# Patient Record
Sex: Female | Born: 1975 | Race: White | Hispanic: No | Marital: Married | Smoking: Never smoker
Health system: Southern US, Community
[De-identification: ages and names within clinical notes are randomized; demographics above are authoritative.]

## PROBLEM LIST (undated history)

## (undated) DIAGNOSIS — N926 Irregular menstruation, unspecified: Secondary | ICD-10-CM

## (undated) DIAGNOSIS — R112 Nausea with vomiting, unspecified: Secondary | ICD-10-CM

## (undated) DIAGNOSIS — E221 Hyperprolactinemia: Secondary | ICD-10-CM

## (undated) DIAGNOSIS — R079 Chest pain, unspecified: Principal | ICD-10-CM

## (undated) HISTORY — DX: Hyperprolactinemia: E22.1

## (undated) HISTORY — PX: ABDOMINAL HYSTERECTOMY: SHX81

## (undated) HISTORY — DX: Chest pain, unspecified: R07.9

## (undated) HISTORY — DX: Irregular menstruation, unspecified: N92.6

## (undated) HISTORY — PX: LAPAROSCOPY: SHX197

---

## 1997-07-15 ENCOUNTER — Other Ambulatory Visit: Admission: RE | Admit: 1997-07-15 | Discharge: 1997-07-15 | Payer: Self-pay | Admitting: Obstetrics and Gynecology

## 1998-08-13 ENCOUNTER — Other Ambulatory Visit: Admission: RE | Admit: 1998-08-13 | Discharge: 1998-08-13 | Payer: Self-pay | Admitting: Obstetrics and Gynecology

## 1999-06-11 ENCOUNTER — Inpatient Hospital Stay (HOSPITAL_COMMUNITY): Admission: AD | Admit: 1999-06-11 | Discharge: 1999-06-11 | Payer: Self-pay | Admitting: Obstetrics & Gynecology

## 2011-09-23 ENCOUNTER — Other Ambulatory Visit: Payer: Self-pay | Admitting: Family Medicine

## 2011-09-23 DIAGNOSIS — R51 Headache: Secondary | ICD-10-CM

## 2011-09-28 ENCOUNTER — Other Ambulatory Visit: Payer: Self-pay

## 2015-07-21 ENCOUNTER — Other Ambulatory Visit: Payer: Self-pay | Admitting: Family Medicine

## 2015-07-21 DIAGNOSIS — R109 Unspecified abdominal pain: Secondary | ICD-10-CM

## 2015-07-23 ENCOUNTER — Other Ambulatory Visit: Payer: Self-pay

## 2015-07-28 ENCOUNTER — Ambulatory Visit
Admission: RE | Admit: 2015-07-28 | Discharge: 2015-07-28 | Disposition: A | Discharge status: Home / Self Care | Payer: BLUE CROSS/BLUE SHIELD | Source: Ambulatory Visit | Attending: Family Medicine | Admitting: Family Medicine

## 2015-07-28 DIAGNOSIS — R109 Unspecified abdominal pain: Secondary | ICD-10-CM

## 2015-11-26 ENCOUNTER — Other Ambulatory Visit (HOSPITAL_COMMUNITY): Payer: Self-pay | Admitting: Gastroenterology

## 2015-11-26 DIAGNOSIS — R1011 Right upper quadrant pain: Secondary | ICD-10-CM

## 2015-12-08 ENCOUNTER — Encounter (HOSPITAL_COMMUNITY)
Admission: RE | Admit: 2015-12-08 | Discharge: 2015-12-08 | Disposition: A | Discharge status: Home / Self Care | Payer: BLUE CROSS/BLUE SHIELD | Source: Ambulatory Visit | Attending: Gastroenterology | Admitting: Gastroenterology

## 2015-12-08 ENCOUNTER — Encounter (HOSPITAL_COMMUNITY): Payer: Self-pay | Admitting: Radiology

## 2015-12-08 DIAGNOSIS — R1011 Right upper quadrant pain: Secondary | ICD-10-CM | POA: Insufficient documentation

## 2015-12-08 MED ORDER — TECHNETIUM TC 99M MEBROFENIN IV KIT
5.0000 | PACK | Freq: Once | INTRAVENOUS | Status: AC | PRN
  Administered 2015-12-08: 5 via INTRAVENOUS

## 2016-03-24 DIAGNOSIS — E221 Hyperprolactinemia: Secondary | ICD-10-CM

## 2016-03-24 HISTORY — DX: Hyperprolactinemia: E22.1

## 2017-04-21 DIAGNOSIS — N926 Irregular menstruation, unspecified: Secondary | ICD-10-CM | POA: Insufficient documentation

## 2017-04-21 HISTORY — DX: Irregular menstruation, unspecified: N92.6

## 2017-11-17 DIAGNOSIS — R079 Chest pain, unspecified: Secondary | ICD-10-CM

## 2017-11-17 HISTORY — DX: Chest pain, unspecified: R07.9

## 2017-11-17 NOTE — Progress Notes (Signed)
Cardiology Office Note:    Date:  11/20/2017   ID:  Lori Tyler, DOB Dec 01, 1975, MRN [id number]  PCP:  Kaleen Mask, MD  Cardiologist:  Norman Herrlich, MD   Referring MD: Kaleen Mask, *  ASSESSMENT:    1. Chest pain in adult   2. Essential hypertension   3. Menstrual migraine without status migrainosus, not intractable    PLAN:    In order of problems listed above:  1. Atypical nonanginal in the setting of Imitrex therapy likely esophageal in etiology.  Her EKG is normal for further evaluation check troponin stress echo and I suspect that she will respond to calcium channel blocker.  Has a history of reflux and will continue Carafate along with her PPI 2. Poorly controlled untreated in the past intolerant of a lipid soluble beta-blocker propranolol I asked her to start a calcium channel blocker for blood pressure control and migraine.  If she is having esophageal pain may also be of benefit 3. I asked her not to use Imitrex again and gave her the name of a neurologist Dr.Stiles Weston Settle at [facility]  Next appointment 4 weeks   Medication Adjustments/Labs and Tests Ordered: Current medicines are reviewed at length with the patient today.  Concerns regarding medicines are outlined above.  Orders Placed This Encounter  Procedures  . Troponin I  . EKG 12-Lead  . ECHOCARDIOGRAM STRESS TEST   Meds ordered this encounter  Medications  . diltiazem (CARDIZEM CD) 240 MG 24 hr capsule    Sig: Take 1 capsule (240 mg total) by mouth daily.    Dispense:  30 capsule    Refill:  1      Chief Complaint  Patient presents with  . Chest Pain    onset 1 month    History of Present Illness:    Lori Tyler is a 42 y.o. female who is being seen today for the evaluation of chest pain at the request of Kaleen Mask, *.  She has a long history of migraine menstrual in etiology.  Recently it worsened she is taken perhaps 10 doses of Imitrex  and after using it last a month ago has developed persistent chest pain.  Her to that with Imitrex she would have chest pain 4 hours substernal pressure rating to the left shoulder at times into the neck.  Nonexertional in nature and relieved with rest however in the last month its been more of a persistent waxing and waning which lasts for hours and at times all day long.  He has no history of underlying heart disease congenital rheumatic but does have hypertension which has been untreated.  She took propranolol which was ineffective to prevent migraine and poorly tolerated with CNS side effects.  She has no exertional shortness of breath palpitations syncope or TIA no family history of cardiomyopathy aortopathy or sudden death Past Medical History:  Diagnosis Date  . Chest pain in adult 11/17/2017  . Hyperprolactinemia (HCC) 03/24/2016  . Irregular menses 04/21/2017   See my not from 07/06/17    Past Surgical History:  Procedure Laterality Date  . LAPAROSCOPY      Current Medications: Current Meds  Medication Sig  . levocetirizine (XYZAL) 5 MG tablet Take 5 mg by mouth every evening.  . promethazine (PHENERGAN) 25 MG tablet as needed. For migraines  . RABEprazole (ACIPHEX) 20 MG tablet Take by mouth 2 (two) times daily.  . SUMAtriptan (IMITREX) 100 MG tablet as needed.  For migraines     Allergies:   Cephalexin   Social History   Socioeconomic History  . Marital status: Married    Spouse name: Not on file  . Number of children: Not on file  . Years of education: Not on file  . Highest education level: Not on file  Occupational History  . Not on file  Social Needs  . Financial resource strain: Not on file  . Food insecurity:    Worry: Not on file    Inability: Not on file  . Transportation needs:    Medical: Not on file    Non-medical: Not on file  Tobacco Use  . Smoking status: Never Smoker  . Smokeless tobacco: Never Used  Substance and Sexual Activity  . Alcohol use: Yes      Comment: socially/ every other weekend  . Drug use: Never  . Sexual activity: Not on file  Lifestyle  . Physical activity:    Days per week: Not on file    Minutes per session: Not on file  . Stress: Not on file  Relationships  . Social connections:    Talks on phone: Not on file    Gets together: Not on file    Attends religious service: Not on file    Active member of club or organization: Not on file    Attends meetings of clubs or organizations: Not on file    Relationship status: Not on file  Other Topics Concern  . Not on file  Social History Narrative  . Not on file     Family History: The patient's family history includes Aneurysm in her paternal grandfather; Arthritis in her mother; Diabetes in her maternal grandmother and mother; Heart attack in her maternal grandfather; Hypertension in her father.  ROS:   Review of Systems  Constitution: Negative.  HENT: Negative.   Eyes: Positive for visual disturbance (with migraine).  Cardiovascular: Positive for chest pain.  Respiratory: Positive for shortness of breath.   Endocrine: Negative.   Hematologic/Lymphatic: Negative.   Skin: Negative.   Musculoskeletal: Negative.   Gastrointestinal: Positive for dysphagia (feels as if something is stuck) and heartburn.  Genitourinary: Negative.   Neurological: Positive for headaches.  Psychiatric/Behavioral: Negative.   Allergic/Immunologic: Negative.    Please see the history of present illness.     All other systems reviewed and are negative.  EKGs/Labs/Other Studies Reviewed:    The following studies were reviewed today:   EKG:  EKG is  ordered today.  The ekg ordered today demonstrates sinus rhythm and is normal  Recent Labs: No results found for requested labs within last 8760 hours.  Recent Lipid Panel No results found for: CHOL, TRIG, HDL, CHOLHDL, VLDL, LDLCALC, LDLDIRECT  Physical Exam:    VS:  BP (!) 160/100 (BP Location: Left Arm, Patient Position:  Sitting, Cuff Size: Normal)   Pulse 78   Ht 5\' 2"  (1.575 m)   Wt 139 lb (63 kg)   SpO2 97%   BMI 25.42 kg/m     Wt Readings from Last 3 Encounters:  11/20/17 139 lb (63 kg)     GEN: There is no chest wall tenderness well nourished, well developed in no acute distress HEENT: Normal NECK: No JVD; No carotid bruits LYMPHATICS: No lymphadenopathy CARDIAC: Rub murmur RRR, no murmurs, rubs, gallops RESPIRATORY:  Clear to auscultation without rales, wheezing or rhonchi  ABDOMEN: Soft, non-tender, non-distended MUSCULOSKELETAL:  No edema; No deformity  SKIN: Warm and dry NEUROLOGIC:  Alert and oriented x 3 PSYCHIATRIC:  Normal affect     Signed, Norman Herrlich, MD  11/20/2017 4:29 PM    [facility]

## 2017-11-20 ENCOUNTER — Ambulatory Visit: Payer: BLUE CROSS/BLUE SHIELD | Admitting: Cardiology

## 2017-11-20 ENCOUNTER — Encounter (INDEPENDENT_AMBULATORY_CARE_PROVIDER_SITE_OTHER): Payer: Self-pay

## 2017-11-20 ENCOUNTER — Encounter: Payer: Self-pay | Admitting: Cardiology

## 2017-11-20 VITALS — BP 160/100 | HR 78 | Ht 62.0 in | Wt 139.0 lb

## 2017-11-20 DIAGNOSIS — R079 Chest pain, unspecified: Secondary | ICD-10-CM

## 2017-11-20 DIAGNOSIS — I10 Essential (primary) hypertension: Secondary | ICD-10-CM | POA: Insufficient documentation

## 2017-11-20 DIAGNOSIS — G43829 Menstrual migraine, not intractable, without status migrainosus: Secondary | ICD-10-CM

## 2017-11-20 MED ORDER — DILTIAZEM HCL ER COATED BEADS 240 MG PO CP24: 240.0000 mg | ORAL_CAPSULE | Freq: Every day | ORAL | 1 refills | Status: DC

## 2017-11-20 NOTE — Patient Instructions (Signed)
Medication Instructions:  Your physician has recommended you make the following change in your medication:   START: cardizem 240 mg one capsule daily   Your physician has requested that you regularly monitor and record your blood pressure readings at home. Please use the same machine at the same time of day to check your readings and record them to bring to your follow-up visit.  Dr Mervin Kung Caleen Essex, DO at [facility]  (Neuroology) for migraine headaches.  Labwork: You will have labs today:  Troponin    Testing/Procedures: Your physician has requested that you have a stress echocardiogram. For further information please visit [email or web address] Please follow instruction sheet as given.    Follow-Up: Your physician recommends that you schedule a follow-up appointment in: 4 weeks   Any Other Special Instructions Will Be Listed Below (If Applicable).     If you need a refill on your cardiac medications before your next appointment, please call your pharmacy.

## 2017-11-21 LAB — TROPONIN I: Troponin I: <0.01 ng/mL (ref 0.00–0.04)

## 2017-11-24 ENCOUNTER — Telehealth: Payer: Self-pay | Admitting: Cardiology

## 2017-11-24 NOTE — Addendum Note (Signed)
Addended by: Crist Fat on: 11/24/2017 11:32 AM   Modules accepted: Orders

## 2017-11-24 NOTE — Telephone Encounter (Signed)
Patient advised to stop Cardizem at this time and to take Claritin or zyrtec as needed for itching. Patient has been scheduled for a nurse visit to check her blood pressure on Monday, 11/27/17 at 8:00 am. Patient verbalized understanding and is agreeable to plan. No further questions.

## 2017-11-24 NOTE — Telephone Encounter (Signed)
Thinks she's having an allergic reaction to cardizem

## 2017-11-24 NOTE — Telephone Encounter (Signed)
Patient called with complaints of generalized itching that started yesterday and a rash that developed this morning from her neck to her legs. Patient was started on Cardizem 240 mg daily on Tuesday, 11/21/17 after her office visit on 11/20/17 and she thinks this is the cause. Patient states she has been checking her blood pressure daily as instructed and it was 120/78 this morning. Will have Dr. Dulce Sellar advise.

## 2017-11-24 NOTE — Telephone Encounter (Signed)
I agree, stop her med  Monday or Tuesday BP check, if > 150/90 start telmasartin 20 mg day  Can use claritin or zyrtec for itching

## 2017-11-27 ENCOUNTER — Ambulatory Visit (INDEPENDENT_AMBULATORY_CARE_PROVIDER_SITE_OTHER): Payer: BLUE CROSS/BLUE SHIELD | Admitting: Cardiology

## 2017-11-27 VITALS — BP 110/80 | HR 80 | Ht 62.0 in | Wt 139.0 lb

## 2017-11-27 DIAGNOSIS — I10 Essential (primary) hypertension: Secondary | ICD-10-CM

## 2017-11-27 NOTE — Progress Notes (Signed)
The patient presented today for BP check regarding recent reaction to diltiazem. Patient presented with rash to her torso, thighs and arms. Per the patient the rash has improved but is still quite bothersome as it is causing itching. Topical OTC benadryl cream was suggested to the patient. BP and pulse were within normal limits, no changes to be made today per Revankar. Patient will follow up as directed previously and monitor her BP at home once daily for 1 week.

## 2017-12-04 ENCOUNTER — Other Ambulatory Visit: Payer: BLUE CROSS/BLUE SHIELD

## 2017-12-07 ENCOUNTER — Other Ambulatory Visit: Payer: Self-pay

## 2017-12-07 ENCOUNTER — Telehealth: Payer: Self-pay

## 2017-12-07 DIAGNOSIS — R079 Chest pain, unspecified: Secondary | ICD-10-CM

## 2017-12-07 DIAGNOSIS — I10 Essential (primary) hypertension: Secondary | ICD-10-CM

## 2017-12-07 NOTE — Addendum Note (Signed)
Addended by: Craige Cotta on: 12/07/2017 10:21 AM   Modules accepted: Orders

## 2017-12-07 NOTE — Telephone Encounter (Signed)
-----   Message from Baldo Daub, MD sent at 12/06/2017  4:10 PM EDT ----- Regarding: RE: Nonauthorized Stress Echo Yes ----- Message ----- From: Britt Bolognese Sent: 12/06/2017   3:59 PM EDT To: Baldo Daub, MD, Kittie Plater Subject: Nonauthorized Stress Echo                      Dr. Dulce Sellar  Pt's stress echo was denied.  Do you want to change to GXT?  " CLINICAL CRITERIA    Advanced cardiac imaging is medically necessary for evaluation of patients who are unable to walk on a treadmill for reasons other than obesity or for patients with the following resting EKG abnormalities: .ST segment depression of one (1) mm or more (within the past 30 days)  .Left bundle branch block (LBBB)  .Digoxin effect  .Left ventricular hypertrophy (LVH) with secondary ST segment changes (repolarization abnormalities)  .Preexcitation syndrome (eg, Wolff-Parkinson-White)  .Ventricular paced rhythm (pacemaker)  Thanks  Charmaine

## 2017-12-07 NOTE — Telephone Encounter (Signed)
Patient was agreeable to have GXT instead of stress echo. Will send to [facility].

## 2017-12-11 ENCOUNTER — Other Ambulatory Visit: Payer: BLUE CROSS/BLUE SHIELD

## 2017-12-22 ENCOUNTER — Ambulatory Visit (INDEPENDENT_AMBULATORY_CARE_PROVIDER_SITE_OTHER): Payer: BLUE CROSS/BLUE SHIELD

## 2017-12-22 DIAGNOSIS — R079 Chest pain, unspecified: Secondary | ICD-10-CM | POA: Diagnosis not present

## 2017-12-22 DIAGNOSIS — I10 Essential (primary) hypertension: Secondary | ICD-10-CM | POA: Diagnosis not present

## 2017-12-22 LAB — EXERCISE TOLERANCE TEST
CHL CUP MPHR: 179 {beats}/min
CHL CUP RESTING HR STRESS: 72 {beats}/min
CHL RATE OF PERCEIVED EXERTION: 16
CSEPEW: 13.4 METS
Exercise duration (min): 12 min
Exercise duration (sec): 0 s
Peak HR: 169 {beats}/min
Percent HR: 94 %

## 2017-12-26 NOTE — Progress Notes (Signed)
Cardiology Office Note:    Date:  12/27/2017   ID:  Lori Tyler, DOB [birth year], MRN [id number]  PCP:  Lori Mask, MD  Cardiologist:  Lori Herrlich, MD    Referring MD: Lori Tyler, *    ASSESSMENT:    1. Chest pain in adult   2. Essential hypertension    PLAN:    In order of problems listed above:  1. Due to Imitrex for all purposes has resolved 2. Blood pressures continue to be elevated up to 150 systolic we reviewed options for treatment I placed on a low-dose of a distal diuretic she will drop off blood pressures in 2 weeks and recheck her renal function   Next appointment: 8 weeks   Medication Adjustments/Labs and Tests Ordered: Current medicines are reviewed at length with the patient today.  Concerns regarding medicines are outlined above.  Orders Placed This Encounter  Procedures  . Basic Metabolic Panel (BMET)   Meds ordered this encounter  Medications  . spironolactone (ALDACTONE) 25 MG tablet    Sig: Take 1 tablet (25 mg total) by mouth daily.    Dispense:  30 tablet    Refill:  3    No chief complaint on file.   History of Present Illness:    Lori Tyler is a 42 y.o. female with a hx of chest pain and hypertension last seen 11/13/17.  She had a treadmill stress test performed at 13.5 minutes 12/22/2017 that was normal. She developed a rash with diltiazem. Compliance with diet, lifestyle and medications: Yes  She is improved still has a sensation as if the pill was stuck in her throat I advised her to go back and be seen by GI again as she has had previous endoscopy and gastritis.  Fortunately did not tolerate a calcium channel blocker patient because she told me it gave her immediate and complete relief of her esophageal symptoms and chest pain.  I did a quick literature search and there is cross-reactivity with the other calcium channel blockers and I would not rechallenge her.  Her blood pressure is often elevated greater than  140 systolic reviewed options for treatment we will place her on a low-dose of the distal diuretic.  Her cardiac stress test was so reassuring at pursue no further evaluation for ischemia. Past Medical History:  Diagnosis Date  . Chest pain in adult 11/17/2017  . Hyperprolactinemia (HCC) 03/24/2016  . Irregular menses 04/21/2017   See my not from 07/06/17    Past Surgical History:  Procedure Laterality Date  . LAPAROSCOPY      Current Medications: Current Meds  Medication Sig  . levocetirizine (XYZAL) 5 MG tablet Take 5 mg by mouth every evening.  . promethazine (PHENERGAN) 25 MG tablet as needed. For migraines  . RABEprazole (ACIPHEX) 20 MG tablet Take by mouth 2 (two) times daily.     Allergies:   Cephalexin and Diltiazem   Social History   Socioeconomic History  . Marital status: Married    Spouse name: Not on file  . Number of children: Not on file  . Years of education: Not on file  . Highest education level: Not on file  Occupational History  . Not on file  Social Needs  . Financial resource strain: Not on file  . Food insecurity:    Worry: Not on file    Inability: Not on file  . Transportation needs:    Medical: Not on file    Non-medical:  Not on file  Tobacco Use  . Smoking status: Never Smoker  . Smokeless tobacco: Never Used  Substance and Sexual Activity  . Alcohol use: Yes    Comment: socially/ every other weekend  . Drug use: Never  . Sexual activity: Not on file  Lifestyle  . Physical activity:    Days per week: Not on file    Minutes per session: Not on file  . Stress: Not on file  Relationships  . Social connections:    Talks on phone: Not on file    Gets together: Not on file    Attends religious service: Not on file    Active member of club or organization: Not on file    Attends meetings of clubs or organizations: Not on file    Relationship status: Not on file  Other Topics Concern  . Not on file  Social History Narrative  . Not on file      Family History: The patient's family history includes Aneurysm in her paternal grandfather; Arthritis in her mother; Diabetes in her maternal grandmother and mother; Heart attack in her maternal grandfather; Hypertension in her father. ROS:   Please see the history of present illness.    All other systems reviewed and are negative.  EKGs/Labs/Other Studies Reviewed:    The following studies were reviewed today:    Recent Labs: No results found for requested labs within last 8760 hours.  Recent Lipid Panel No results found for: CHOL, TRIG, HDL, CHOLHDL, VLDL, LDLCALC, LDLDIRECT  Physical Exam:    VS:  BP 112/84 (BP Location: Left Arm, Patient Position: Sitting, Cuff Size: Normal)   Pulse 78   Ht 5\' 2"  (1.575 m)   Wt 136 lb 9.6 oz (62 kg)   SpO2 98%   BMI 24.98 kg/m     Wt Readings from Last 3 Encounters:  12/27/17 136 lb 9.6 oz (62 kg)  11/27/17 139 lb (63 kg)  11/20/17 139 lb (63 kg)     GEN:  Well nourished, well developed in no acute distress HEENT: Normal NECK: No JVD; No carotid bruits LYMPHATICS: No lymphadenopathy CARDIAC: RRR, no murmurs, rubs, gallops RESPIRATORY:  Clear to auscultation without rales, wheezing or rhonchi  ABDOMEN: Soft, non-tender, non-distended MUSCULOSKELETAL:  No edema; No deformity  SKIN: Warm and dry NEUROLOGIC:  Alert and oriented x 3 PSYCHIATRIC:  Normal affect    Signed, Lori Herrlich, MD  12/27/2017 9:27 AM    [facility]

## 2017-12-27 ENCOUNTER — Ambulatory Visit (INDEPENDENT_AMBULATORY_CARE_PROVIDER_SITE_OTHER): Payer: BLUE CROSS/BLUE SHIELD | Admitting: Cardiology

## 2017-12-27 ENCOUNTER — Encounter: Payer: Self-pay | Admitting: Cardiology

## 2017-12-27 VITALS — BP 112/84 | HR 78 | Ht 62.0 in | Wt 136.6 lb

## 2017-12-27 DIAGNOSIS — R079 Chest pain, unspecified: Secondary | ICD-10-CM

## 2017-12-27 DIAGNOSIS — I10 Essential (primary) hypertension: Secondary | ICD-10-CM

## 2017-12-27 MED ORDER — SPIRONOLACTONE 25 MG PO TABS: 25.0000 mg | ORAL_TABLET | Freq: Every day | ORAL | 3 refills | Status: DC

## 2017-12-27 NOTE — Patient Instructions (Addendum)
Medication Instructions:  Your physician has recommended you make the following change in your medication:   START spironolactone (aldactone) 25 mg: Take 1 tablet daily   If you need a refill on your cardiac medications before your next appointment, please call your pharmacy.   Lab work: Your physician recommends that you return for lab work in 2 weeks: BMP. Please return to our [place] office for labs, no appointment needed. No need to fast. We are open from M-F 8-5.  **When you return for labs, please bring your blood pressure readings for Dr. Dulce Sellar to review.   If you have labs (blood work) drawn today and your tests are completely normal, you will receive your results only by: Marland Kitchen MyChart Message (if you have MyChart) OR . A paper copy in the mail If you have any lab test that is abnormal or we need to change your treatment, we will call you to review the results.  Testing/Procedures: None  Follow-Up: At [facility], you and your health needs are our priority.  As part of our continuing mission to provide you with exceptional heart care, we have created designated Provider Care Teams.  These Care Teams include your primary Cardiologist (physician) and Advanced Practice Providers (APPs -  Physician Assistants and Nurse Practitioners) who all work together to provide you with the care you need, when you need it. You will need a follow up appointment in 8 weeks.      Spironolactone tablets What is this medicine? SPIRONOLACTONE (speer on oh LAK tone) is a diuretic. It helps you make more urine and to lose excess water from your body. This medicine is used to treat high blood pressure, and edema or swelling from heart, kidney, or liver disease. It is also used to treat patients who make too much aldosterone or have low potassium. This medicine may be used for other purposes; ask your health care provider or pharmacist if you have questions. COMMON BRAND NAME(S): Aldactone What  should I tell my health care provider before I take this medicine? They need to know if you have any of these conditions: -high blood level of potassium -kidney disease or trouble making urine -liver disease -an unusual or allergic reaction to spironolactone, other medicines, foods, dyes, or preservatives -pregnant or trying to get pregnant -breast-feeding How should I use this medicine? Take this medicine by mouth with a drink of water. Follow the directions on your prescription label. You can take it with or without food. If it upsets your stomach, take it with food. Do not take your medicine more often than directed. Remember that you will need to pass more urine after taking this medicine. Do not take your doses at a time of day that will cause you problems. Do not take at bedtime. Talk to your pediatrician regarding the use of this medicine in children. While this drug may be prescribed for selected conditions, precautions do apply. Overdosage: If you think you have taken too much of this medicine contact a poison control center or emergency room at once. NOTE: This medicine is only for you. Do not share this medicine with others. What if I miss a dose? If you miss a dose, take it as soon as you can. If it is almost time for your next dose, take only that dose. Do not take double or extra doses. What may interact with this medicine? Do not take this medicine with any of the following medications: -eplerenone This medicine may also interact with  the following medications: -corticosteroids -digoxin -lithium -medicines for high blood pressure like ACE inhibitors -skeletal muscle relaxants like tubocurarine -NSAIDs, medicines for pain and inflammation, like ibuprofen or naproxen -potassium products like salt substitute or supplements -pressor amines like norepinephrine -some diuretics This list may not describe all possible interactions. Give your health care provider a list of all the  medicines, herbs, non-prescription drugs, or dietary supplements you use. Also tell them if you smoke, drink alcohol, or use illegal drugs. Some items may interact with your medicine. What should I watch for while using this medicine? Visit your doctor or health care professional for regular checks on your progress. Check your blood pressure as directed. Ask your doctor what your blood pressure should be, and when you should contact them. You may need to be on a special diet while taking this medicine. Ask your doctor. Also, ask how many glasses of fluid you need to drink a day. You must not get dehydrated. This medicine may make you feel confused, dizzy or lightheaded. Drinking alcohol and taking some medicines can make this worse. Do not drive, use machinery, or do anything that needs mental alertness until you know how this medicine affects you. Do not sit or stand up quickly. What side effects may I notice from receiving this medicine? Side effects that you should report to your doctor or health care professional as soon as possible: -allergic reactions such as skin rash or itching, hives, swelling of the lips, mouth, tongue, or throat -black or tarry stools -fast, irregular heartbeat -fever -muscle pain, cramps -numbness, tingling in hands or feet -trouble breathing -trouble passing urine -unusual bleeding -unusually weak or tired Side effects that usually do not require medical attention (report to your doctor or health care professional if they continue or are bothersome): -change in voice or hair growth -confusion -dizzy, drowsy -dry mouth, increased thirst -enlarged or tender breasts -headache -irregular menstrual periods -sexual difficulty, unable to have an erection -stomach upset This list may not describe all possible side effects. Call your doctor for medical advice about side effects. You may report side effects to FDA at 1-800-FDA-1088. Where should I keep my  medicine? Keep out of the reach of children. Store below 25 degrees C (77 degrees F). Throw away any unused medicine after the expiration date. NOTE: This sheet is a summary. It may not cover all possible information. If you have questions about this medicine, talk to your doctor, pharmacist, or health care provider.  2018 Elsevier/Gold Standard (2009-10-20 12:51:30)

## 2018-01-04 ENCOUNTER — Other Ambulatory Visit: Payer: Self-pay | Admitting: Gastroenterology

## 2018-01-04 DIAGNOSIS — R0989 Other specified symptoms and signs involving the circulatory and respiratory systems: Secondary | ICD-10-CM

## 2018-01-05 ENCOUNTER — Ambulatory Visit
Admission: RE | Admit: 2018-01-05 | Discharge: 2018-01-05 | Disposition: A | Discharge status: Home / Self Care | Payer: BLUE CROSS/BLUE SHIELD | Source: Ambulatory Visit | Attending: Gastroenterology | Admitting: Gastroenterology

## 2018-01-05 DIAGNOSIS — R0989 Other specified symptoms and signs involving the circulatory and respiratory systems: Secondary | ICD-10-CM

## 2018-01-23 ENCOUNTER — Encounter: Payer: Self-pay | Admitting: Cardiology

## 2018-02-26 ENCOUNTER — Ambulatory Visit: Payer: BLUE CROSS/BLUE SHIELD | Admitting: Cardiology

## 2019-11-19 ENCOUNTER — Telehealth: Payer: Self-pay | Admitting: Family

## 2019-11-19 ENCOUNTER — Other Ambulatory Visit: Payer: Self-pay | Admitting: Family

## 2019-11-19 DIAGNOSIS — U071 COVID-19: Secondary | ICD-10-CM

## 2019-11-19 NOTE — Telephone Encounter (Signed)
Sx 11/12/19. Test 11/15/19 which was positive.   I connected by phone with Tommye Standard on 11/19/2019 at 4:40 PM to discuss the potential use of a new treatment for mild to moderate COVID-19 viral infection in non-hospitalized patients.  This patient is a 44 y.o. female that meets the FDA criteria for Emergency Use Authorization of COVID monoclonal antibody casirivimab/imdevimab or bamlanivimab/eteseviamb.  Has a (+) direct SARS-CoV-2 viral test result  Has mild or moderate COVID-19   Is NOT hospitalized due to COVID-19  Is within 10 days of symptom onset  Has at least one of the high risk factor(s) for progression to severe COVID-19 and/or hospitalization as defined in EUA.  Specific high risk criteria : BMI > 25 and Chronic Lung Disease   I have spoken and communicated the following to the patient or parent/caregiver regarding COVID monoclonal antibody treatment:  1. FDA has authorized the emergency use for the treatment of mild to moderate COVID-19 in adults and pediatric patients with positive results of direct SARS-CoV-2 viral testing who are 63 years of age and older weighing at least 40 kg, and who are at high risk for progressing to severe COVID-19 and/or hospitalization.  2. The significant known and potential risks and benefits of COVID monoclonal antibody, and the extent to which such potential risks and benefits are unknown.  3. Information on available alternative treatments and the risks and benefits of those alternatives, including clinical trials.  4. Patients treated with COVID monoclonal antibody should continue to self-isolate and use infection control measures (e.g., wear mask, isolate, social distance, avoid sharing personal items, clean and disinfect "high touch" surfaces, and frequent handwashing) according to CDC guidelines.   5. The patient or parent/caregiver has the option to accept or refuse COVID monoclonal antibody treatment.  After reviewing this  information with the patient, the patient has agreed to receive one of the available covid 19 monoclonal antibodies and will be provided an appropriate fact sheet prior to infusion. Alver Sorrow, NP 11/19/2019 4:40 PM

## 2019-11-19 NOTE — Telephone Encounter (Signed)
Called to Discuss with patient about Covid symptoms and the use of the monoclonal antibody infusion for those with mild to moderate Covid symptoms and at a high risk of hospitalization.     Pt appears to qualify for this infusion due to co-morbid conditions (BMI >25, asthma) and/or a member of an at-risk group in accordance with the FDA Emergency Use Authorization.    Unable to reach pt. Left VM requesting call back to [facility] hotline.   Alver Sorrow, NP

## 2019-11-20 ENCOUNTER — Ambulatory Visit (HOSPITAL_COMMUNITY)
Admission: RE | Admit: 2019-11-20 | Discharge: 2019-11-20 | Disposition: A | Discharge status: Home / Self Care | Payer: BC Managed Care – PPO | Source: Ambulatory Visit | Attending: Pulmonary Disease | Admitting: Pulmonary Disease

## 2019-11-20 DIAGNOSIS — U071 COVID-19: Secondary | ICD-10-CM | POA: Diagnosis present

## 2019-11-20 MED ORDER — FAMOTIDINE IN NACL 20-0.9 MG/50ML-% IV SOLN: 20.0000 mg | Freq: Once | INTRAVENOUS | Status: DC | PRN

## 2019-11-20 MED ORDER — ALBUTEROL SULFATE HFA 108 (90 BASE) MCG/ACT IN AERS: 2.0000 | INHALATION_SPRAY | Freq: Once | RESPIRATORY_TRACT | Status: DC | PRN

## 2019-11-20 MED ORDER — EPINEPHRINE 0.3 MG/0.3ML IJ SOAJ: 0.3000 mg | Freq: Once | INTRAMUSCULAR | Status: DC | PRN

## 2019-11-20 MED ORDER — SODIUM CHLORIDE 0.9 % IV SOLN: INTRAVENOUS | Status: DC | PRN

## 2019-11-20 MED ORDER — DIPHENHYDRAMINE HCL 50 MG/ML IJ SOLN: 50.0000 mg | Freq: Once | INTRAMUSCULAR | Status: DC | PRN

## 2019-11-20 MED ORDER — METHYLPREDNISOLONE SODIUM SUCC 125 MG IJ SOLR: 125.0000 mg | Freq: Once | INTRAMUSCULAR | Status: DC | PRN

## 2019-11-20 MED ORDER — SODIUM CHLORIDE 0.9 % IV SOLN
1200.0000 mg | Freq: Once | INTRAVENOUS | Status: AC
  Administered 2019-11-20: 1200 mg via INTRAVENOUS

## 2019-11-20 NOTE — Progress Notes (Signed)
  Diagnosis: COVID-19  Physician: Dr Wright  Procedure: [facility] Infusion [facility] Med: casirivimab\imdevimab infusion - Provided patient with casirivimab\imdevimab fact sheet for patients, parents and caregivers prior to infusion.  Complications: No immediate complications noted.  Discharge: Discharged home   [name] 11/20/2019  

## 2019-11-20 NOTE — Discharge Instructions (Signed)

## 2020-11-06 IMAGING — RF DG ESOPHAGUS
11 of 12 series · 14 of 24 positions shown · non-contrast
Comparison: None.

CLINICAL DATA: Esophageal dysphagia and globus sensation.

EXAM:
ESOPHOGRAM / BARIUM SWALLOW / BARIUM TABLET STUDY
TECHNIQUE: Combined double contrast and single contrast examination performed
using effervescent crystals, thick barium liquid, and thin barium
liquid. The patient was observed with fluoroscopy swallowing a 13 mm
barium sulphate tablet.
FLUOROSCOPY TIME:  Fluoroscopy Time:  2 minutes and 30 seconds
Radiation Exposure Index (if provided by the fluoroscopic device):
62 mGy
Number of Acquired Spot Images: 0

[Series 1: sequence · 0.31mm/px · 2 of 11 frames shown (1 of 10)]
[frame 2/11]
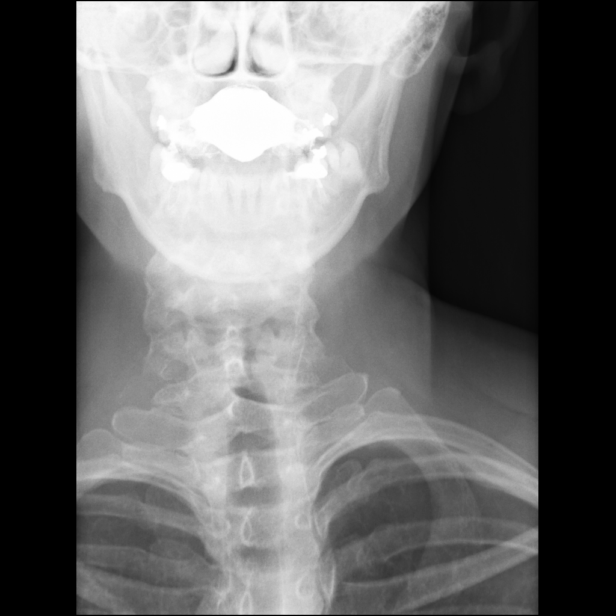
[frame 11/11]
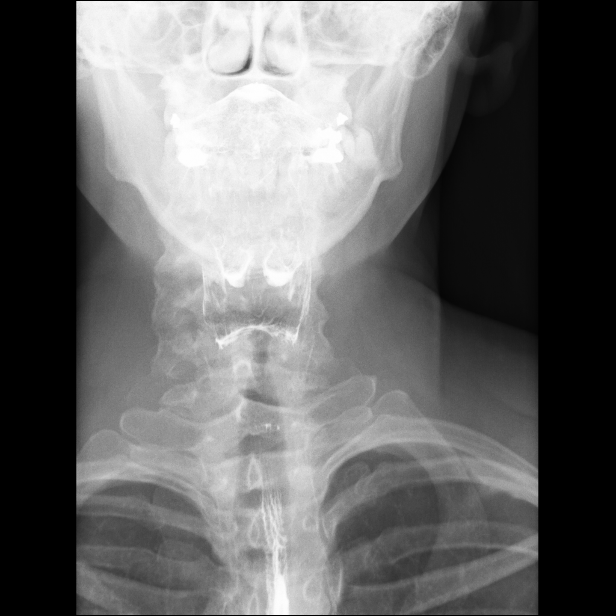

[Series 2: sequence · 0.31mm/px · 1 of 10 frames shown (2 of 10)]
[frame 9/10]
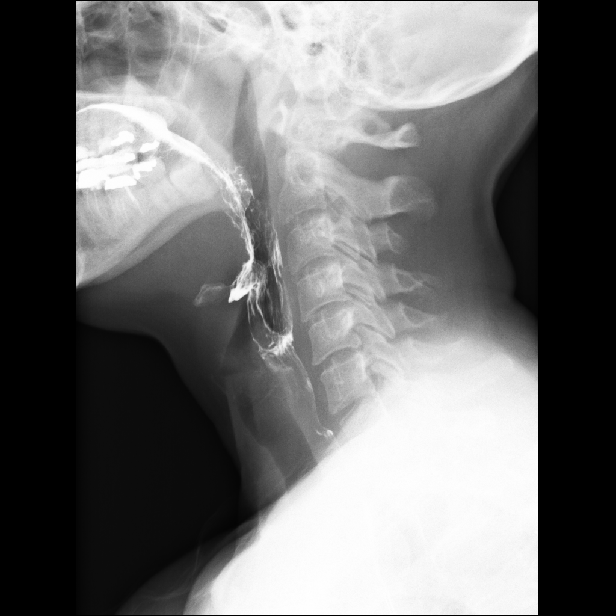

[Series 3: sequence · 0.28mm/px · 1 of 12 frames shown (3 of 10)]
[frame 10/12]
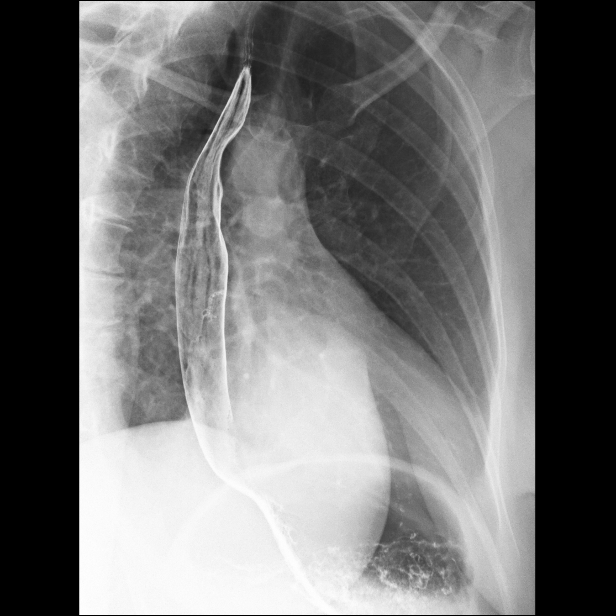

[Series 4: sequence · 0.28mm/px · 1 of 7 frames shown (4 of 10)]
[frame 2/7]
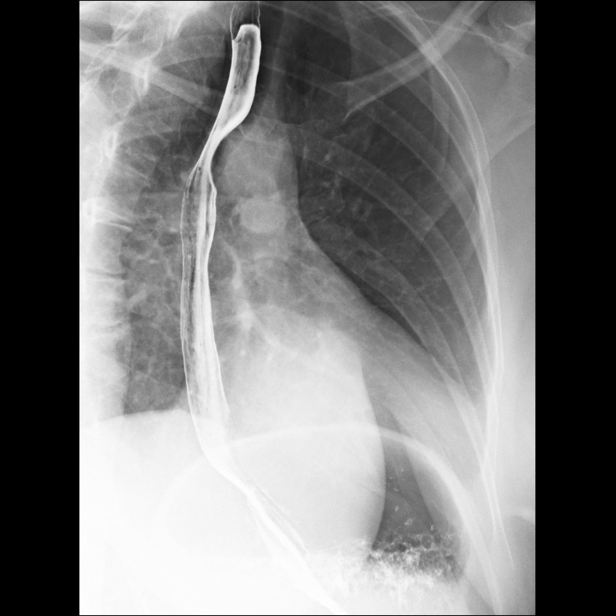

[Series 5: sequence · 1 of 15 frames shown (5 of 10)]
[frame 3/15]
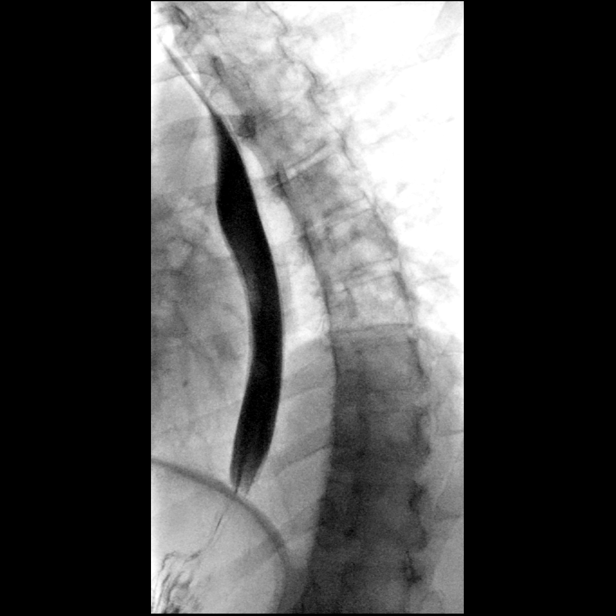

[Series 6: sequence · 2 of 21 frames shown (6 of 10)]
[frame 4/21]
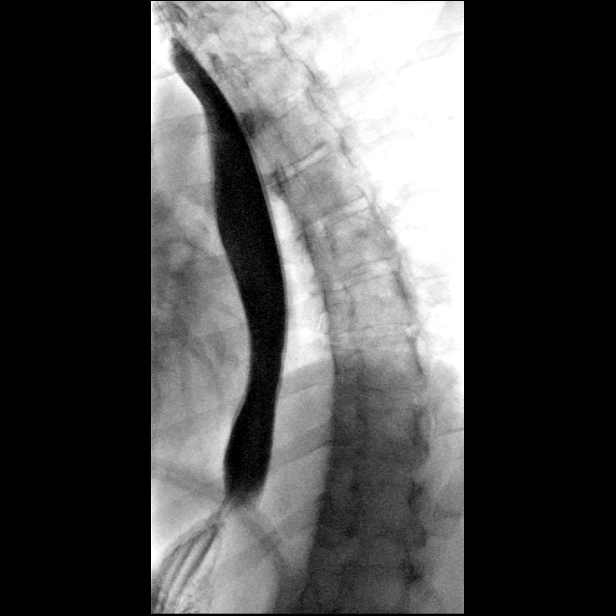
[frame 11/21]
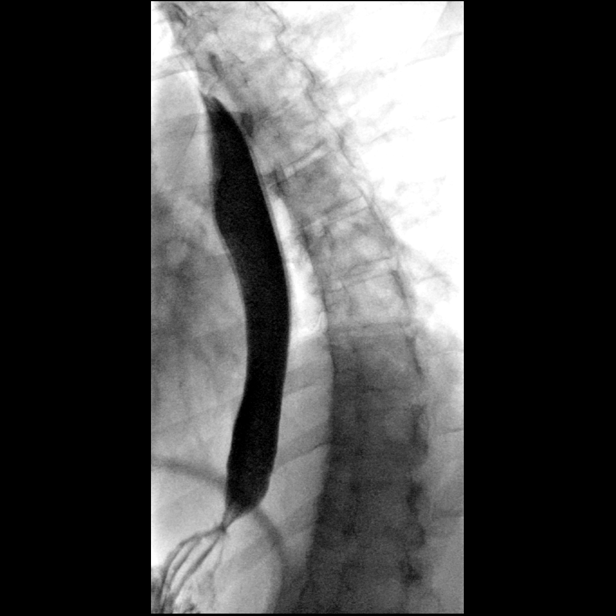

[Series 7: sequence · 1 of 29 frames shown (7 of 10)]
[frame 5/29]
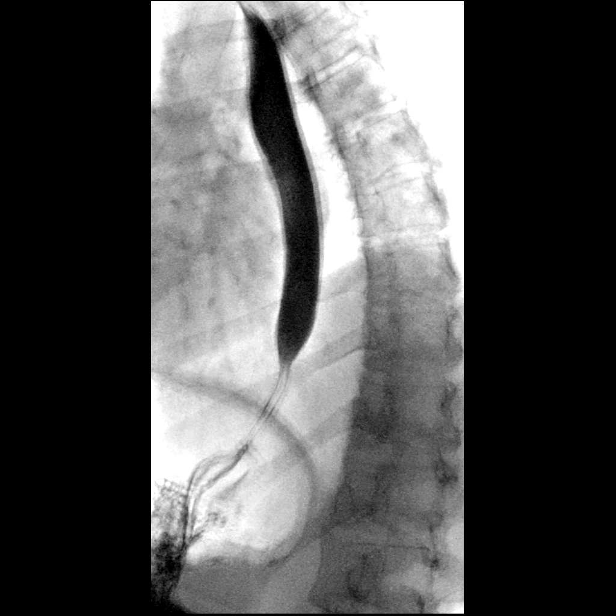

[Series 8: sequence · 1 of 23 frames shown (8 of 10)]
[frame 4/23]
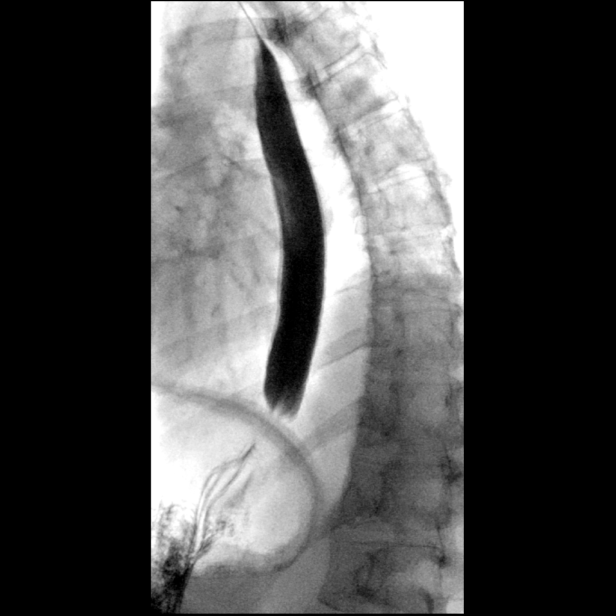

[Series 9: sequence · 2 of 13 frames shown (9 of 10)]
[frame 2/13]
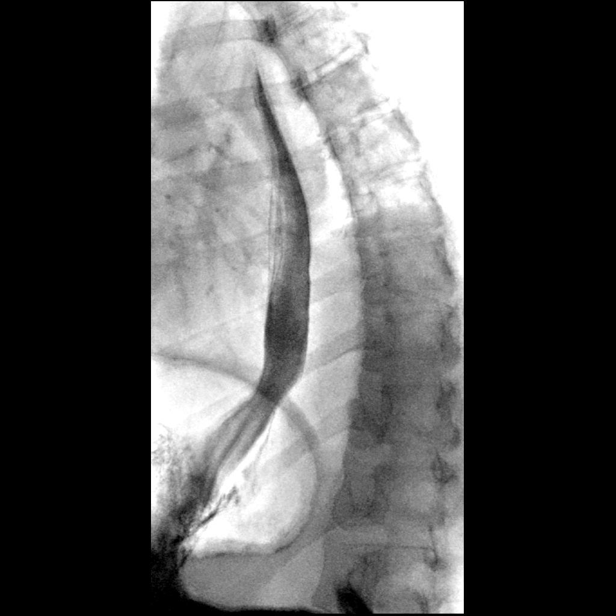
[frame 7/13]
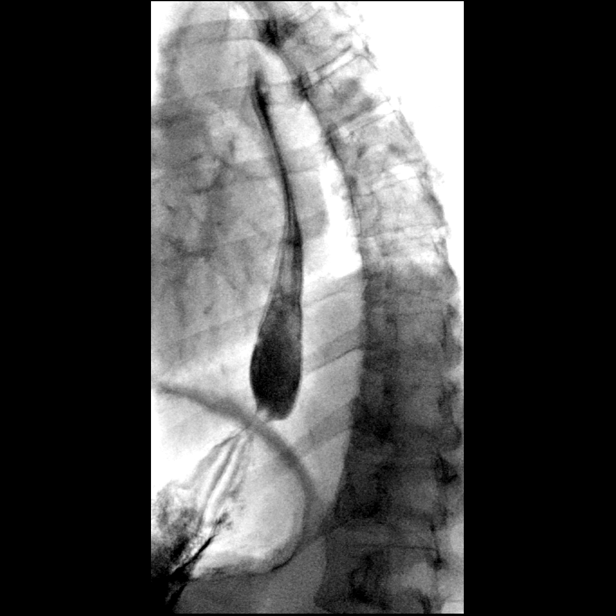

[Series 11: sequence · 1 of 16 frames shown (10 of 10)]
[frame 3/16]
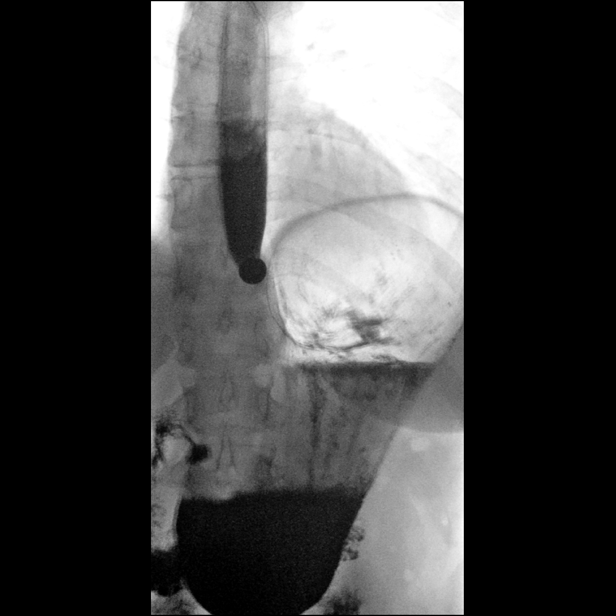

[Series 12: one shot · 1 of 1 slices shown]
[im 1/1]
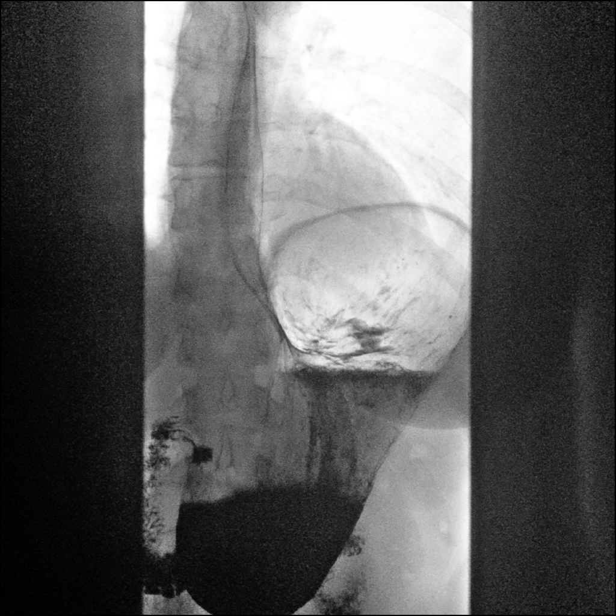

[14 of 24 positions shown; findings below may reference images not displayed]

FINDINGS: Initial barium swallows demonstrate normal pharyngeal motion with
swallowing. No laryngeal penetration or aspiration. No upper
esophageal webs, strictures or diverticuli.

Normal esophageal motility. No intrinsic or extrinsic lesions of the
esophagus are identified. No mucosal abnormalities. No distal mass
or stricture. There is a small sliding-type hiatal hernia but no GE
reflux was demonstrated.

The 13 mm barium tablet hung up briefly at the level of the aortic
arch and also briefly down near the GE junction but did pass into
the stomach.
IMPRESSION: 1. Normal esophageal motility.
2. Very small sliding-type hiatal hernia but no GE reflux
demonstrated.
3. No esophageal mass or stricture. The 13 mm barium tablet hung up
briefly at the level of the aortic arch and also briefly at the GE
junction.

## 2023-03-24 ENCOUNTER — Ambulatory Visit
Admission: RE | Admit: 2023-03-24 | Discharge: 2023-03-24 | Disposition: A | Discharge status: Home / Self Care | Payer: BC Managed Care – PPO | Source: Ambulatory Visit | Attending: Nurse Practitioner | Admitting: Nurse Practitioner

## 2023-03-24 ENCOUNTER — Other Ambulatory Visit: Payer: Self-pay | Admitting: Nurse Practitioner

## 2023-03-24 DIAGNOSIS — M79671 Pain in right foot: Secondary | ICD-10-CM

## 2023-04-21 ENCOUNTER — Ambulatory Visit: Payer: BC Managed Care – PPO | Admitting: Podiatry

## 2023-04-21 DIAGNOSIS — M722 Plantar fascial fibromatosis: Secondary | ICD-10-CM

## 2023-04-21 DIAGNOSIS — M792 Neuralgia and neuritis, unspecified: Secondary | ICD-10-CM | POA: Diagnosis not present

## 2023-04-21 MED ORDER — DICLOFENAC SODIUM 75 MG PO TBEC: 75.0000 mg | DELAYED_RELEASE_TABLET | Freq: Two times a day (BID) | ORAL | 0 refills | Status: AC

## 2023-04-21 NOTE — Progress Notes (Signed)
 Chief Complaint  Patient presents with   Foot Pain    NP, right foot. The pinpoint pain is at the medial side where the heel meets the arch. It will spred thro the arch and around to the lateral side of the heel . Started March of last year. She has tried 3 round of predzione, injection, k-tape, heel lifts, night splints, stretchs, PT, antiinflamatories, and icing. The only thing she has not done is be put in a cam walker. Been treated by NP at Dr. Windle Guard office. Not diabetic, no anti coag.    HPI: 48 y.o. female presents today with previously diagnosed, chronic right plantar fasciitis since last March.  PCP has been treating her with 3 rounds of oral prednisone, she is currently going to physical therapy, night splints and arch supports.  States that her pain is actually getting worse.  Notes stinging and burning along the medial heel.  She is here for second opinion.  She has an x-ray report with her noting and inferior calcaneal spur present.  She is currently taking gabapentin at bedtime because her heel pain was preventing her from being able to sleep.  She also had recently been placed on colchicine for the heel pain but this did not provide any relief.  She is currently going to [facility] in [place].  Past Medical History:  Diagnosis Date   Chest pain in adult 11/17/2017   Hyperprolactinemia (HCC) 03/24/2016   Irregular menses 04/21/2017   See my not from 07/06/17    Past Surgical History:  Procedure Laterality Date   LAPAROSCOPY      Allergies  Allergen Reactions   Sumatriptan Anaphylaxis and Other (See Comments)    Reaction: Cardiovascular Arrest (ALLERGY/intolerance)   Cephalexin Rash   Diltiazem Rash     Physical Exam: Clinical exam noted significant tenderness around the plantar medial heel and plantar central heel.  Positive Tinel's sign of the posterior tibial nerve on the right.  Ankle dorsiflexion is within normal limits.  She does have a higher  arched foot type with tightness to the medial and central plantar fascial bands.  External x-rays show an inferior calcaneal spur.  There are palpable pedal pulses.  Assessment/Plan of Care: 1. Neuralgia and neuritis   2. Plantar fasciitis, right      Meds ordered this encounter  Medications   diclofenac (VOLTAREN) 75 MG EC tablet    Sig: Take 1 tablet (75 mg total) by mouth 2 (two) times daily.    Dispense:  60 tablet    Refill:  0   AMB REFERRAL TO NEUROLOGY  Discussed clinical findings with patient today.  Informed the patient that she does have a positive finding that could indicate tarsal tunnel syndrome.  Sometimes when plantar fasciitis becomes chronic and inflamed over an extended period of time, this can also cause neuritis of the calcaneal branch, and not be formal tarsal tunnel syndrome.  I will set her up for an EMG/NCV to evaluate the right lower extremity and rule out tarsal tunnel syndrome versus radiculopathy versus normal exam.  Depending on the outcome of the EMG/NCV we may try tarsal tunnel block to see if she has any improvement.  Prescription for diclofenac 75 mg 1 tablet p.o. twice daily was sent to her pharmacy to see if she responds better to this anti-inflammatory  Follow-up after the neurology appointment/nerve conduction study.  Clerance Lav, DPM, FACFAS [facility]  [address] [place], [place] [address]                Office [phone]  Fax [phone]

## 2023-04-27 ENCOUNTER — Encounter: Payer: Self-pay | Admitting: Neurology

## 2023-06-16 ENCOUNTER — Ambulatory Visit: Admitting: Neurology

## 2023-10-03 ENCOUNTER — Other Ambulatory Visit: Payer: Self-pay | Admitting: Orthopedic Surgery

## 2023-10-27 ENCOUNTER — Ambulatory Visit
Admission: RE | Admit: 2023-10-27 | Discharge: 2023-10-27 | Disposition: A | Discharge status: Home / Self Care | Source: Ambulatory Visit | Attending: Nurse Practitioner | Admitting: Nurse Practitioner

## 2023-10-27 ENCOUNTER — Other Ambulatory Visit: Payer: Self-pay | Admitting: Nurse Practitioner

## 2023-10-27 DIAGNOSIS — M79672 Pain in left foot: Secondary | ICD-10-CM

## 2023-11-08 ENCOUNTER — Other Ambulatory Visit: Payer: Self-pay | Admitting: Nurse Practitioner

## 2023-11-08 DIAGNOSIS — M79672 Pain in left foot: Secondary | ICD-10-CM

## 2023-11-10 ENCOUNTER — Encounter: Payer: Self-pay | Admitting: Nurse Practitioner

## 2023-11-16 ENCOUNTER — Ambulatory Visit
Admission: RE | Admit: 2023-11-16 | Discharge: 2023-11-16 | Disposition: A | Discharge status: Home / Self Care | Source: Ambulatory Visit | Attending: Nurse Practitioner | Admitting: Nurse Practitioner

## 2023-11-16 DIAGNOSIS — M79672 Pain in left foot: Secondary | ICD-10-CM

## 2023-11-30 ENCOUNTER — Other Ambulatory Visit

## 2023-12-14 ENCOUNTER — Other Ambulatory Visit: Payer: Self-pay

## 2023-12-14 ENCOUNTER — Encounter (HOSPITAL_BASED_OUTPATIENT_CLINIC_OR_DEPARTMENT_OTHER): Payer: Self-pay | Admitting: Orthopedic Surgery

## 2023-12-20 NOTE — Progress Notes (Signed)
 Pt sent text reminder to pick up pre-surgical drink.

## 2023-12-20 NOTE — H&P (Cosign Needed)
   Chief Complaint: Right heel pain HPI: Patient is a 48 year old female who presents to the OR today for definitive treatment for her chronic right plantar heel pain.  She has failed conservative treatment consisting of immobilization in a cast, orthotics, ice, prednisone Dosepak, cortisone injection, heel lifts, KT tape, and previous physical therapy and massage, and elects for surgical intervention.  Allergies:  Allergies  Allergen Reactions   Sumatriptan Anaphylaxis and Other (See Comments)    Reaction: Cardiovascular Arrest (ALLERGY/intolerance)   Cephalexin Rash   Diltiazem  Rash    Past Medical History:  Diagnosis Date   Chest pain in adult 11/17/2017   Laryngeal spasms   Hyperprolactinemia 03/24/2016   Irregular menses 04/21/2017   See my not from 07/06/17   PONV (postoperative nausea and vomiting)     Past Surgical History:  Procedure Laterality Date   ABDOMINAL HYSTERECTOMY     LAPAROSCOPY      Family History: Family History  Problem Relation Age of Onset   Diabetes Mother    Arthritis Mother    Hypertension Father    Diabetes Maternal Grandmother    Heart attack Maternal Grandfather    Aneurysm Paternal Grandfather     Social History:   reports that she has never smoked. She has never used smokeless tobacco. She reports current alcohol use. She reports that she does not use drugs.  Medications: No medications prior to admission.    No results found for this or any previous visit (from the past 48 hours).  No results found.    Height 5' 2 (1.575 m), weight 67.1 kg, last menstrual period 01/05/2018.  PE:  well nourished and well developed.  NAD.  EOMI.  Resp unlabored. Tight heel cord on the right.  Tender to palpation over the medial cord plantar fascia.  Assessment/Plan Right plantar fasciitis  Patient presents to the definitive treatment for her chronic right plantar heel pain.  She will require right gastroc recession and plantar fascia release.   After reviewing the procedure, postoperative protocol, and risks of surgery the patient elects for surgical intervention.  The patient specifically understands risks of bleeding, infection, nerve damage, blood clots, need for additional surgery, continued pain, nonunion, post traumatic arthritis, recurrence of deformity, amputation and death.  Eva Barrack PA-C [facility] Office:  [phone]   Agree with findings documented above.  To the OR today for right gastroc recession and plantar fascia release to treat her chronic plantar fasciitis and tight heelcord.  The risks and benefits of the alternative treatment options have been discussed in detail.  The patient wishes to proceed with surgery and specifically understands risks of bleeding, infection, nerve damage, blood clots, need for additional surgery, amputation and death.

## 2023-12-20 NOTE — Progress Notes (Signed)

## 2023-12-21 ENCOUNTER — Ambulatory Visit (HOSPITAL_BASED_OUTPATIENT_CLINIC_OR_DEPARTMENT_OTHER)
Admission: RE | Admit: 2023-12-21 | Discharge: 2023-12-21 | Disposition: A | Discharge status: Home / Self Care | Attending: Orthopedic Surgery | Admitting: Orthopedic Surgery

## 2023-12-21 ENCOUNTER — Ambulatory Visit (HOSPITAL_BASED_OUTPATIENT_CLINIC_OR_DEPARTMENT_OTHER): Payer: Self-pay | Admitting: Anesthesiology

## 2023-12-21 ENCOUNTER — Encounter (HOSPITAL_BASED_OUTPATIENT_CLINIC_OR_DEPARTMENT_OTHER): Payer: Self-pay | Admitting: Orthopedic Surgery

## 2023-12-21 ENCOUNTER — Encounter (HOSPITAL_BASED_OUTPATIENT_CLINIC_OR_DEPARTMENT_OTHER)
Admission: RE | Disposition: A | Discharge status: Home / Self Care | Payer: Self-pay | Source: Home / Self Care | Attending: Orthopedic Surgery

## 2023-12-21 DIAGNOSIS — M722 Plantar fascial fibromatosis: Secondary | ICD-10-CM | POA: Insufficient documentation

## 2023-12-21 DIAGNOSIS — I10 Essential (primary) hypertension: Secondary | ICD-10-CM | POA: Insufficient documentation

## 2023-12-21 DIAGNOSIS — G8929 Other chronic pain: Secondary | ICD-10-CM | POA: Diagnosis not present

## 2023-12-21 DIAGNOSIS — M79671 Pain in right foot: Secondary | ICD-10-CM | POA: Insufficient documentation

## 2023-12-21 DIAGNOSIS — M6701 Short Achilles tendon (acquired), right ankle: Secondary | ICD-10-CM | POA: Insufficient documentation

## 2023-12-21 HISTORY — DX: Nausea with vomiting, unspecified: R11.2

## 2023-12-21 HISTORY — PX: GASTROCNEMIUS RECESSION: SHX863

## 2023-12-21 HISTORY — PX: PLANTAR FASCIA RELEASE: SHX2239

## 2023-12-21 SURGERY — RECESSION, MUSCLE, GASTROCNEMIUS
Anesthesia: General | Site: Leg Lower | Laterality: Right

## 2023-12-21 MED ORDER — ONDANSETRON HCL 4 MG/2ML IJ SOLN
INTRAMUSCULAR | Status: AC
Start: 2023-12-21 — End: 2023-12-21
  Filled 2023-12-21: qty 2

## 2023-12-21 MED ORDER — ACETAMINOPHEN 500 MG PO TABS
ORAL_TABLET | ORAL | Status: AC
  Filled 2023-12-21: qty 2

## 2023-12-21 MED ORDER — AMISULPRIDE (ANTIEMETIC) 5 MG/2ML IV SOLN
INTRAVENOUS | Status: AC
  Filled 2023-12-21: qty 2

## 2023-12-21 MED ORDER — BUPIVACAINE LIPOSOME 1.3 % IJ SUSP
INTRAMUSCULAR | Status: DC | PRN
  Administered 2023-12-21 (×2): 10 mL via PERINEURAL

## 2023-12-21 MED ORDER — ASPIRIN 81 MG PO TBEC: 81.0000 mg | DELAYED_RELEASE_TABLET | Freq: Two times a day (BID) | ORAL | 0 refills | Status: AC

## 2023-12-21 MED ORDER — PROPOFOL 10 MG/ML IV BOLUS
INTRAVENOUS | Status: DC | PRN
  Administered 2023-12-21: 130 mg via INTRAVENOUS

## 2023-12-21 MED ORDER — PROPOFOL 10 MG/ML IV BOLUS
INTRAVENOUS | Status: AC
Start: 2023-12-21 — End: 2023-12-21
  Filled 2023-12-21: qty 20

## 2023-12-21 MED ORDER — ACETAMINOPHEN 500 MG PO TABS
1000.0000 mg | ORAL_TABLET | Freq: Once | ORAL | Status: AC
  Administered 2023-12-21: 1000 mg via ORAL

## 2023-12-21 MED ORDER — LEVOFLOXACIN IN D5W 500 MG/100ML IV SOLN: 500.0000 mg | INTRAVENOUS | Status: DC

## 2023-12-21 MED ORDER — DEXMEDETOMIDINE HCL IN NACL 80 MCG/20ML IV SOLN
INTRAVENOUS | Status: DC | PRN
  Administered 2023-12-21: 8 ug via INTRAVENOUS

## 2023-12-21 MED ORDER — OXYCODONE HCL 5 MG PO TABS: 5.0000 mg | ORAL_TABLET | Freq: Once | ORAL | Status: DC | PRN

## 2023-12-21 MED ORDER — 0.9 % SODIUM CHLORIDE (POUR BTL) OPTIME
TOPICAL | Status: DC | PRN
  Administered 2023-12-21: 50 mL

## 2023-12-21 MED ORDER — VANCOMYCIN HCL IN DEXTROSE 1-5 GM/200ML-% IV SOLN
INTRAVENOUS | Status: AC
  Filled 2023-12-21: qty 200

## 2023-12-21 MED ORDER — LIDOCAINE 2% (20 MG/ML) 5 ML SYRINGE
INTRAMUSCULAR | Status: DC | PRN
  Administered 2023-12-21: 20 mg via INTRAVENOUS

## 2023-12-21 MED ORDER — FENTANYL CITRATE (PF) 100 MCG/2ML IJ SOLN
INTRAMUSCULAR | Status: DC | PRN
  Administered 2023-12-21: 25 ug via INTRAVENOUS

## 2023-12-21 MED ORDER — OXYCODONE HCL 5 MG/5ML PO SOLN: 5.0000 mg | Freq: Once | ORAL | Status: DC | PRN

## 2023-12-21 MED ORDER — FENTANYL CITRATE (PF) 100 MCG/2ML IJ SOLN: 25.0000 ug | INTRAMUSCULAR | Status: DC | PRN

## 2023-12-21 MED ORDER — MIDAZOLAM HCL (PF) 2 MG/2ML IJ SOLN
2.0000 mg | Freq: Once | INTRAMUSCULAR | Status: AC
Start: 2023-12-21 — End: 2023-12-21
  Administered 2023-12-21: 2 mg via INTRAVENOUS

## 2023-12-21 MED ORDER — LACTATED RINGERS IV SOLN: INTRAVENOUS | Status: DC

## 2023-12-21 MED ORDER — MIDAZOLAM HCL 2 MG/2ML IJ SOLN
INTRAMUSCULAR | Status: AC
  Filled 2023-12-21: qty 2

## 2023-12-21 MED ORDER — VANCOMYCIN HCL IN DEXTROSE 1-5 GM/200ML-% IV SOLN: 1000.0000 mg | INTRAVENOUS | Status: DC

## 2023-12-21 MED ORDER — VANCOMYCIN HCL 500 MG IV SOLR
INTRAVENOUS | Status: DC | PRN
  Administered 2023-12-21: 500 mg via TOPICAL

## 2023-12-21 MED ORDER — CEFAZOLIN SODIUM-DEXTROSE 2-4 GM/100ML-% IV SOLN
2.0000 g | Freq: Once | INTRAVENOUS | Status: AC
  Administered 2023-12-21: 2 g via INTRAVENOUS

## 2023-12-21 MED ORDER — OXYCODONE HCL 5 MG PO TABS: 5.0000 mg | ORAL_TABLET | Freq: Four times a day (QID) | ORAL | 0 refills | Status: AC | PRN

## 2023-12-21 MED ORDER — DEXMEDETOMIDINE HCL IN NACL 80 MCG/20ML IV SOLN
INTRAVENOUS | Status: AC
  Filled 2023-12-21: qty 20

## 2023-12-21 MED ORDER — PROPOFOL 500 MG/50ML IV EMUL
INTRAVENOUS | Status: AC
Start: 2023-12-21 — End: 2023-12-21
  Filled 2023-12-21: qty 50

## 2023-12-21 MED ORDER — KETOROLAC TROMETHAMINE 30 MG/ML IJ SOLN
INTRAMUSCULAR | Status: DC | PRN
  Administered 2023-12-21: 30 mg via INTRAVENOUS

## 2023-12-21 MED ORDER — LEVOFLOXACIN IN D5W 500 MG/100ML IV SOLN
INTRAVENOUS | Status: AC
  Filled 2023-12-21: qty 100

## 2023-12-21 MED ORDER — ARTIFICIAL TEARS OPHTHALMIC OINT
TOPICAL_OINTMENT | OPHTHALMIC | Status: AC
  Filled 2023-12-21: qty 7

## 2023-12-21 MED ORDER — FENTANYL CITRATE (PF) 100 MCG/2ML IJ SOLN
INTRAMUSCULAR | Status: AC
  Filled 2023-12-21: qty 2

## 2023-12-21 MED ORDER — BUPIVACAINE HCL (PF) 0.25 % IJ SOLN
INTRAMUSCULAR | Status: DC | PRN
Start: 2023-12-21 — End: 2023-12-21
  Administered 2023-12-21: 10 mL via PERINEURAL
  Administered 2023-12-21: 20 mL via PERINEURAL

## 2023-12-21 MED ORDER — DEXAMETHASONE SOD PHOSPHATE PF 10 MG/ML IJ SOLN
INTRAMUSCULAR | Status: DC | PRN
  Administered 2023-12-21: 5 mg via INTRAVENOUS

## 2023-12-21 MED ORDER — SCOPOLAMINE 1 MG/3DAYS TD PT72
MEDICATED_PATCH | TRANSDERMAL | Status: DC | PRN
  Administered 2023-12-21: 1 via TRANSDERMAL

## 2023-12-21 MED ORDER — DOCUSATE SODIUM 100 MG PO CAPS: 100.0000 mg | ORAL_CAPSULE | Freq: Two times a day (BID) | ORAL | 0 refills | Status: AC

## 2023-12-21 MED ORDER — FENTANYL CITRATE (PF) 100 MCG/2ML IJ SOLN
100.0000 ug | Freq: Once | INTRAMUSCULAR | Status: AC
  Administered 2023-12-21: 100 ug via INTRAVENOUS

## 2023-12-21 MED ORDER — SCOPOLAMINE 1 MG/3DAYS TD PT72
MEDICATED_PATCH | TRANSDERMAL | Status: AC
  Filled 2023-12-21: qty 1

## 2023-12-21 MED ORDER — CEFAZOLIN SODIUM-DEXTROSE 2-4 GM/100ML-% IV SOLN
INTRAVENOUS | Status: AC
  Filled 2023-12-21: qty 100

## 2023-12-21 MED ORDER — EPHEDRINE SULFATE-NACL 50-0.9 MG/10ML-% IV SOSY
PREFILLED_SYRINGE | INTRAVENOUS | Status: DC | PRN
  Administered 2023-12-21 (×2): 5 mg via INTRAVENOUS

## 2023-12-21 MED ORDER — AMISULPRIDE (ANTIEMETIC) 5 MG/2ML IV SOLN
10.0000 mg | Freq: Once | INTRAVENOUS | Status: AC | PRN
  Administered 2023-12-21: 10 mg via INTRAVENOUS

## 2023-12-21 MED ORDER — SENNA 8.6 MG PO TABS: 2.0000 | ORAL_TABLET | Freq: Two times a day (BID) | ORAL | 0 refills | Status: AC

## 2023-12-21 MED ORDER — LIDOCAINE 2% (20 MG/ML) 5 ML SYRINGE
INTRAMUSCULAR | Status: AC
Start: 2023-12-21 — End: 2023-12-21
  Filled 2023-12-21: qty 5

## 2023-12-21 MED ORDER — KETOROLAC TROMETHAMINE 30 MG/ML IJ SOLN
INTRAMUSCULAR | Status: AC
Start: 2023-12-21 — End: 2023-12-21
  Filled 2023-12-21: qty 1

## 2023-12-21 MED ORDER — ONDANSETRON HCL 4 MG/2ML IJ SOLN
INTRAMUSCULAR | Status: DC | PRN
Start: 2023-12-21 — End: 2023-12-21
  Administered 2023-12-21: 4 mg via INTRAVENOUS

## 2023-12-21 SURGICAL SUPPLY — 45 items
BLADE SURG 15 STRL LF DISP TIS (BLADE) ×1 IMPLANT
BNDG COMPR ESMARK 6X3 LF (GAUZE/BANDAGES/DRESSINGS) IMPLANT
BNDG ELASTIC 4INX 5YD STR LF (GAUZE/BANDAGES/DRESSINGS) IMPLANT
BNDG ELASTIC 6INX 5YD STR LF (GAUZE/BANDAGES/DRESSINGS) IMPLANT
BOOT STEPPER DURA LG (SOFTGOODS) IMPLANT
BOOT STEPPER DURA MED (SOFTGOODS) IMPLANT
BOOT STEPPER DURA XLG (SOFTGOODS) IMPLANT
CHLORAPREP W/TINT 26 (MISCELLANEOUS) ×2 IMPLANT
COVER BACK TABLE 60X90IN (DRAPES) ×1 IMPLANT
CUFF TRNQT CYL 34X4.125X (TOURNIQUET CUFF) IMPLANT
DRAPE EXTREMITY T 121X128X90 (DISPOSABLE) ×2 IMPLANT
DRAPE U-SHAPE 47X51 STRL (DRAPES) ×1 IMPLANT
DRSG MEPITEL 4X7.2 (GAUZE/BANDAGES/DRESSINGS) ×1 IMPLANT
ELECTRODE REM PT RTRN 9FT ADLT (ELECTROSURGICAL) ×1 IMPLANT
GAUZE PAD ABD 8X10 STRL (GAUZE/BANDAGES/DRESSINGS) ×1 IMPLANT
GAUZE SPONGE 4X4 12PLY STRL (GAUZE/BANDAGES/DRESSINGS) ×2 IMPLANT
GLOVE BIO SURGEON STRL SZ8 (GLOVE) ×2 IMPLANT
GLOVE BIOGEL PI IND STRL 8 (GLOVE) ×2 IMPLANT
GLOVE ECLIPSE 8.0 STRL XLNG CF (GLOVE) ×2 IMPLANT
GOWN STRL REUS W/ TWL LRG LVL3 (GOWN DISPOSABLE) ×2 IMPLANT
GOWN STRL REUS W/ TWL XL LVL3 (GOWN DISPOSABLE) ×2 IMPLANT
NDL HYPO 22X1.5 SAFETY MO (MISCELLANEOUS) IMPLANT
NDL HYPO 25X1 1.5 SAFETY (NEEDLE) IMPLANT
NEEDLE HYPO 22X1.5 SAFETY MO (MISCELLANEOUS) IMPLANT
NEEDLE HYPO 25X1 1.5 SAFETY (NEEDLE) IMPLANT
PACK BASIN DAY SURGERY FS (CUSTOM PROCEDURE TRAY) ×2 IMPLANT
PAD CAST 4YDX4 CTTN HI CHSV (CAST SUPPLIES) ×2 IMPLANT
PADDING CAST COTTON 6X4 STRL (CAST SUPPLIES) IMPLANT
PENCIL SMOKE EVACUATOR (MISCELLANEOUS) ×2 IMPLANT
SANITIZER HAND ALTRA PUMP 550 (MISCELLANEOUS) ×1 IMPLANT
SHEET MEDIUM DRAPE 40X70 STRL (DRAPES) ×1 IMPLANT
SOLN 0.9% NACL POUR BTL 1000ML (IV SOLUTION) ×2 IMPLANT
SPLINT PLASTER CAST FAST 5X30 (CAST SUPPLIES) IMPLANT
SPONGE T-LAP 18X18 ~~LOC~~+RFID (SPONGE) ×2 IMPLANT
STOCKINETTE 6 STRL (DRAPES) ×2 IMPLANT
SUCTION TUBE FRAZIER 10FR DISP (SUCTIONS) IMPLANT
SUT ETHILON 3 0 PS 1 (SUTURE) ×2 IMPLANT
SUT MNCRL AB 3-0 PS2 18 (SUTURE) ×2 IMPLANT
SUT VICRYL 0 SH 27 (SUTURE) IMPLANT
SYR BULB EAR ULCER 3OZ GRN STR (SYRINGE) ×2 IMPLANT
SYR CONTROL 10ML LL (SYRINGE) IMPLANT
TOWEL GREEN STERILE FF (TOWEL DISPOSABLE) ×1 IMPLANT
TUBE CONNECTING 20X1/4 (TUBING) IMPLANT
UNDERPAD 30X36 HEAVY ABSORB (UNDERPADS AND DIAPERS) ×2 IMPLANT
YANKAUER SUCT BULB TIP NO VENT (SUCTIONS) IMPLANT

## 2023-12-21 NOTE — Anesthesia Preprocedure Evaluation (Signed)
 Anesthesia Evaluation  Patient identified by MRN, date of birth, ID band Patient awake    Reviewed: Allergy & Precautions, NPO status , Patient's Chart, lab work & pertinent test results  History of Anesthesia Complications (+) PONV and history of anesthetic complications  Airway Mallampati: II  TM Distance: >3 FB Neck ROM: Full    Dental  (+) Dental Advisory Given   Pulmonary neg pulmonary ROS   breath sounds clear to auscultation       Cardiovascular hypertension, Pt. on medications  Rhythm:Regular Rate:Normal     Neuro/Psych negative neurological ROS     GI/Hepatic negative GI ROS, Neg liver ROS,,,  Endo/Other  negative endocrine ROS    Renal/GU negative Renal ROS     Musculoskeletal   Abdominal   Peds  Hematology negative hematology ROS (+)   Anesthesia Other Findings   Reproductive/Obstetrics                              Anesthesia Physical Anesthesia Plan  ASA: 2  Anesthesia Plan: General   Post-op Pain Management: Regional block*, Tylenol PO (pre-op)* and Toradol IV (intra-op)*   Induction: Intravenous  PONV Risk Score and Plan: 4 or greater and Ondansetron, Dexamethasone, Propofol infusion, Midazolam and Treatment may vary due to age or medical condition  Airway Management Planned: LMA  Additional Equipment:   Intra-op Plan:   Post-operative Plan: Extubation in OR  Informed Consent: I have reviewed the patients History and Physical, chart, labs and discussed the procedure including the risks, benefits and alternatives for the proposed anesthesia with the patient or authorized representative who has indicated his/her understanding and acceptance.     Dental advisory given  Plan Discussed with: CRNA  Anesthesia Plan Comments:          Anesthesia Quick Evaluation

## 2023-12-21 NOTE — Progress Notes (Signed)
Assisted Dr. Rob Fitzgerald with right, adductor canal, popliteal, ultrasound guided block. Side rails up, monitors on throughout procedure. See vital signs in flow sheet. Tolerated Procedure well. 

## 2023-12-21 NOTE — Discharge Instructions (Addendum)
 Toni Arthurs, MD [facility]  Please read the following information regarding your care after surgery.  Medications  You only need a prescription for the narcotic pain medicine (ex. oxycodone, Percocet, Norco).  All of the other medicines listed below are available over the counter. ? Aleve 2 pills twice a day for the first 3 days after surgery. ? acetominophen (Tylenol) 650 mg every 4-6 hours as you need for minor to moderate pain ? oxycodone as prescribed for severe pain  Narcotic pain medicine (ex. oxycodone, Percocet, Vicodin) will cause constipation.  To prevent this problem, take the following medicines while you are taking any pain medicine. ? docusate sodium (Colace) 100 mg twice a day ? senna (Senokot) 2 tablets twice a day  ? To help prevent blood clots, take a baby aspirin (81 mg) twice a day for two weeks after surgery.  You should also get up every hour while you are awake to move around.    Weight Bearing ? Do not bear any weight on the operated leg or foot.  Cast / Splint / Dressing ? Keep your splint, cast or dressing clean and dry.  Don't put anything (coat hanger, pencil, etc) down inside of it.  If it gets damp, use a hair dryer on the cool setting to dry it.  If it gets soaked, call the office to schedule an appointment for a cast change.  After your dressing, cast or splint is removed; you may shower, but do not soak or scrub the wound.  Allow the water to run over it, and then gently pat it dry.  Swelling It is normal for you to have swelling where you had surgery.  To reduce swelling and pain, keep your toes above your nose for at least 3 days after surgery.  It may be necessary to keep your foot or leg elevated for several weeks.  If it hurts, it should be elevated.  Follow Up Call my office at [phone] when you are discharged from the hospital or surgery center to schedule an appointment to be seen two weeks after surgery.  Call my office at [phone] if  you develop a fever >101.5 F, nausea, vomiting, bleeding from the surgical site or severe pain.     Regional Anesthesia Blocks  1. You may not be able to move or feel the "blocked" extremity after a regional anesthetic block. This may last may last from 3-48 hours after placement, but it will go away. The length of time depends on the medication injected and your individual response to the medication. As the nerves start to wake up, you may experience tingling as the movement and feeling returns to your extremity. If the numbness and inability to move your extremity has not gone away after 48 hours, please call your surgeon.   2. The extremity that is blocked will need to be protected until the numbness is gone and the strength has returned. Because you cannot feel it, you will need to take extra care to avoid injury. Because it may be weak, you may have difficulty moving it or using it. You may not know what position it is in without looking at it while the block is in effect.  3. For blocks in the legs and feet, returning to weight bearing and walking needs to be done carefully. You will need to wait until the numbness is entirely gone and the strength has returned. You should be able to move your leg and foot normally before you try  and bear weight or walk. You will need someone to be with you when you first try to ensure you do not fall and possibly risk injury.  4. Bruising and tenderness at the needle site are common side effects and will resolve in a few days.  5. Persistent numbness or new problems with movement should be communicated to the surgeon or the [facility] [phone] [facility] [phone]).   Information for Discharge Teaching: EXPAREL (bupivacaine liposome injectable suspension)   Pain relief is important to your recovery. The goal is to control your pain so you can move easier and return to your normal activities as soon as possible after  your procedure. Your physician may use several types of medicines to manage pain, swelling, and more.  Your surgeon or anesthesiologist gave you EXPAREL(bupivacaine) to help control your pain after surgery.  EXPAREL is a local anesthetic designed to release slowly over an extended period of time to provide pain relief by numbing the tissue around the surgical site. EXPAREL is designed to release pain medication over time and can control pain for up to 72 hours. Depending on how you respond to EXPAREL, you may require less pain medication during your recovery. EXPAREL can help reduce or eliminate the need for opioids during the first few days after surgery when pain relief is needed the most. EXPAREL is not an opioid and is not addictive. It does not cause sleepiness or sedation.   Important! A teal colored band has been placed on your arm with the date, time and amount of EXPAREL you have received. Please leave this armband in place for the full 96 hours following administration, and then you may remove the band. If you return to the hospital for any reason within 96 hours following the administration of EXPAREL, the armband provides important information that your health care providers to know, and alerts them that you have received this anesthetic.    Possible side effects of EXPAREL: Temporary loss of sensation or ability to move in the area where medication was injected. Nausea, vomiting, constipation Rarely, numbness and tingling in your mouth or lips, lightheadedness, or anxiety may occur. Call your doctor right away if you think you may be experiencing any of these sensations, or if you have other questions regarding possible side effects.  Follow all other discharge instructions given to you by your surgeon or nurse. Eat a healthy diet and drink plenty of water or other fluids.   Post Anesthesia Home Care Instructions  Activity: Get plenty of rest for the remainder of the day. A  responsible individual must stay with you for 24 hours following the procedure.  For the next 24 hours, DO NOT: -Drive a car -Advertising copywriter -Drink alcoholic beverages -Take any medication unless instructed by your physician -Make any legal decisions or sign important papers.  Meals: Start with liquid foods such as gelatin or soup. Progress to regular foods as tolerated. Avoid greasy, spicy, heavy foods. If nausea and/or vomiting occur, drink only clear liquids until the nausea and/or vomiting subsides. Call your physician if vomiting continues.  Special Instructions/Symptoms: Your throat may feel dry or sore from the anesthesia or the breathing tube placed in your throat during surgery. If this causes discomfort, gargle with warm salt water. The discomfort should disappear within 24 hours.  If you had a scopolamine patch placed behind your ear for the management of post- operative nausea and/or vomiting:  1. The medication in the patch is  effective for 72 hours, after which it should be removed.  Wrap patch in a tissue and discard in the trash. Wash hands thoroughly with soap and water. 2. You may remove the patch earlier than 72 hours if you experience unpleasant side effects which may include dry mouth, dizziness or visual disturbances. 3. Avoid touching the patch. Wash your hands with soap and water after contact with the patch.

## 2023-12-21 NOTE — Anesthesia Procedure Notes (Signed)
 Procedure Name: LMA Insertion Date/Time: 12/21/2023 7:35 AM  Performed by: Claudene Delon SQUIBB, CRNAPre-anesthesia Checklist: Patient identified, Emergency Drugs available, Suction available and Patient being monitored Patient Re-evaluated:Patient Re-evaluated prior to induction Oxygen Delivery Method: Circle System Utilized Preoxygenation: Pre-oxygenation with 100% oxygen Induction Type: IV induction Ventilation: Mask ventilation without difficulty LMA: LMA inserted LMA Size: 4.0 Number of attempts: 1 Airway Equipment and Method: Bite block Placement Confirmation: positive ETCO2 Tube secured with: Tape Dental Injury: Teeth and Oropharynx as per pre-operative assessment

## 2023-12-21 NOTE — Anesthesia Procedure Notes (Signed)
 Anesthesia Regional Block: Popliteal block   Pre-Anesthetic Checklist: , timeout performed,  Correct Patient, Correct Site, Correct Laterality,  Correct Procedure, Correct Position, site marked,  Risks and benefits discussed,  Surgical consent,  Pre-op evaluation,  At surgeon's request and post-op pain management  Laterality: Right  Prep: chloraprep       Needles:  Injection technique: Single-shot  Needle Type: Echogenic Needle     Needle Length: 9cm  Needle Gauge: 21     Additional Needles:   Procedures:,,,, ultrasound used (permanent image in chart),,    Narrative:  Start time: 12/21/2023 7:05 AM End time: 12/21/2023 7:12 AM Injection made incrementally with aspirations every 5 mL.  Performed by: Personally  Anesthesiologist: Epifanio Charleston, MD

## 2023-12-21 NOTE — Anesthesia Postprocedure Evaluation (Signed)
 Anesthesia Post Note  Patient: Lori Tyler  Procedure(s) Performed: RECESSION, MUSCLE, GASTROCNEMIUS (Right: Leg Lower) RELEASE, FASCIA, PLANTAR (Right: Leg Lower)     Patient location during evaluation: PACU Anesthesia Type: General Level of consciousness: awake and alert Pain management: pain level controlled Vital Signs Assessment: post-procedure vital signs reviewed and stable Respiratory status: spontaneous breathing, nonlabored ventilation, respiratory function stable and patient connected to nasal cannula oxygen Cardiovascular status: blood pressure returned to baseline and stable Postop Assessment: no apparent nausea or vomiting Anesthetic complications: no   No notable events documented.  Last Vitals:  Vitals:   12/21/23 0845 12/21/23 0921  BP: 115/78 124/76  Pulse: 75 72  Resp: (!) 22 20  Temp:  (!) 36.2 C  SpO2: 98% 100%    Last Pain:  Vitals:   12/21/23 0921  TempSrc:   PainSc: 0-No pain                 Epifanio Lamar BRAVO

## 2023-12-21 NOTE — Op Note (Signed)
 12/21/2023  8:20 AM  PATIENT:  Lori Tyler  48 y.o. female  PRE-OPERATIVE DIAGNOSIS: 1.  Chronic plantar fasciitis of the right foot 2.  Right short Achilles tendon  POST-OPERATIVE DIAGNOSIS: Same  Procedure(s): 1.  Right gastrocnemius recession 2.  Right plantar fascia release through separate incision  SURGEON:  Norleen Armor, MD  ASSISTANT: Eva Barrack, PA-C  ANESTHESIA:   General, regional  EBL:  minimal   TOURNIQUET:   Total Tourniquet Time Documented: Thigh (Right) - 15 minutes Total: Thigh (Right) - 15 minutes  COMPLICATIONS:  None apparent  DISPOSITION:  Extubated, awake and stable to recovery.  INDICATION FOR PROCEDURE: 48 year old female with chronic right heel pain due to plantar fasciitis.  She also has a tight heel cord.  She has failed nonoperative treatment including activity modification, physical therapy, oral anti-inflammatories and shoewear modification.  She presents now for surgical treatment.  The risks and benefits of the alternative treatment options have been discussed in detail.  The patient wishes to proceed with surgery and specifically understands risks of bleeding, infection, nerve damage, blood clots, need for additional surgery, amputation and death.   PROCEDURE IN DETAIL:  After pre operative consent was obtained, and the correct operative site was identified, the patient was brought to the operating room and placed supine on the OR table.  Anesthesia was administered.  Pre-operative antibiotics were administered.  A surgical timeout was taken.  The right lower extremity was prepped and draped in standard sterile fashion with a tourniquet around the thigh.  Extremity was elevated, and the tourniquet was inflated to 250 mmHg.  A longitudinal incision was made over the calf.  Dissection was carried sharply down through the subcutaneous tissues.  Care was taken to protect the saphenous nerve and vein.  Superficial fascia was incised.  The  gastrocnemius tendon was identified.  The sural nerve was protected.  The tendon was divided from medial to lateral under direct vision.  The plantaris tendon was also released.  The ankle was then dorsiflexed to 30 degrees past neutral with the knee extended.  The wounds irrigated and closed with Monocryl and nylon after sprinkling with vancomycin powder.  Attention was turned to the plantar arch.  Medial cord plantar fascia was identified.  A small incision was made at the proximal and of the plantar fascia distal to the heel pad.  Dissection was carried sharply down through the subcutaneous tissues.  Plantar fascia was identified.  Dissection was carried superficial and deep to the plantar fascia.  It then divided under direct vision.  The wound was irrigated and sprinkled with vancomycin powder the incision was closed with nylon.  Sterile dressings were applied followed by a well-padded short leg splint.  The tourniquet was released after application of dressings.  The patient was awakened from anesthesia and transported to the recovery room in stable condition.   FOLLOW UP PLAN: Nonweightbearing on the right lower extremity for 2 weeks postop.  Follow-up then for suture removal and conversion to a cam boot and to start physical therapy.  Aspirin for DVT prophylaxis.   RADIOGRAPHS:    Justin Ollis PA-C was present and scrubbed for the duration of the operative case. His assistance was essential in positioning the patient, prepping and draping, gaining and maintaining exposure, performing the operation, closing and dressing the wounds and applying the splint.

## 2023-12-21 NOTE — Anesthesia Procedure Notes (Signed)
 Anesthesia Regional Block: Adductor canal block   Pre-Anesthetic Checklist: , timeout performed,  Correct Patient, Correct Site, Correct Laterality,  Correct Procedure, Correct Position, site marked,  Risks and benefits discussed,  Surgical consent,  Pre-op evaluation,  At surgeon's request and post-op pain management  Laterality: Right  Prep: chloraprep       Needles:  Injection technique: Single-shot  Needle Type: Echogenic Needle     Needle Length: 9cm  Needle Gauge: 21     Additional Needles:   Procedures:,,,, ultrasound used (permanent image in chart),,    Narrative:  Start time: 12/21/2023 7:12 AM End time: 12/21/2023 7:15 AM Injection made incrementally with aspirations every 5 mL.  Performed by: Personally  Anesthesiologist: Epifanio Charleston, MD

## 2023-12-21 NOTE — Transfer of Care (Signed)
 Immediate Anesthesia Transfer of Care Note  Patient: Lori Tyler  Procedure(s) Performed: RECESSION, MUSCLE, GASTROCNEMIUS (Right: Leg Lower) RELEASE, FASCIA, PLANTAR (Right: Leg Lower)  Patient Location: PACU  Anesthesia Type:General  Level of Consciousness: awake and patient cooperative  Airway & Oxygen Therapy: Patient Spontanous Breathing and Patient connected to face mask oxygen  Post-op Assessment: Report given to RN and Post -op Vital signs reviewed and stable  Post vital signs: Reviewed and stable  Last Vitals:  Vitals Value Taken Time  BP 106/64 12/21/23 08:16  Temp 36.7 C 12/21/23 08:15  Pulse 90 12/21/23 08:20  Resp 17 12/21/23 08:20  SpO2 100 % 12/21/23 08:20  Vitals shown include unfiled device data.  Last Pain:  Vitals:   12/21/23 0815  TempSrc:   PainSc: Asleep      Patients Stated Pain Goal: 3 (12/21/23 0631)  Complications: No notable events documented.

## 2023-12-22 ENCOUNTER — Encounter (HOSPITAL_BASED_OUTPATIENT_CLINIC_OR_DEPARTMENT_OTHER): Payer: Self-pay | Admitting: Orthopedic Surgery
